# Patient Record
Sex: Male | Born: 1970 | Race: White | Hispanic: No | Smoking: Current every day smoker
Health system: Southern US, Community
[De-identification: ages and names within clinical notes are randomized; demographics above are authoritative.]

---

## 2015-08-09 ENCOUNTER — Encounter (HOSPITAL_COMMUNITY): Payer: Self-pay | Admitting: Family Medicine

## 2015-08-09 ENCOUNTER — Emergency Department (HOSPITAL_COMMUNITY): Payer: Self-pay

## 2015-08-09 ENCOUNTER — Emergency Department (HOSPITAL_COMMUNITY)
Admission: EM | Admit: 2015-08-09 | Discharge: 2015-08-09 | Disposition: A | Discharge status: Home / Self Care | Payer: Self-pay | Attending: Physician Assistant | Admitting: Physician Assistant

## 2015-08-09 DIAGNOSIS — Z720 Tobacco use: Secondary | ICD-10-CM | POA: Insufficient documentation

## 2015-08-09 DIAGNOSIS — K047 Periapical abscess without sinus: Secondary | ICD-10-CM | POA: Insufficient documentation

## 2015-08-09 DIAGNOSIS — K029 Dental caries, unspecified: Secondary | ICD-10-CM | POA: Insufficient documentation

## 2015-08-09 LAB — CBC WITH DIFFERENTIAL/PLATELET
BASOS ABS: 0.1 10*3/uL (ref 0.0–0.1)
Basophils Relative: 0 %
Eosinophils Absolute: 0.2 10*3/uL (ref 0.0–0.7)
Eosinophils Relative: 1 %
HEMATOCRIT: 43.4 % (ref 39.0–52.0)
Hemoglobin: 14.3 g/dL (ref 13.0–17.0)
LYMPHS PCT: 14 %
Lymphs Abs: 1.9 10*3/uL (ref 0.7–4.0)
MCH: 31.1 pg (ref 26.0–34.0)
MCHC: 32.9 g/dL (ref 30.0–36.0)
MCV: 94.3 fL (ref 78.0–100.0)
MONO ABS: 1.2 10*3/uL — AB (ref 0.1–1.0)
Monocytes Relative: 9 %
NEUTROS ABS: 10.3 10*3/uL — AB (ref 1.7–7.7)
Neutrophils Relative %: 76 %
Platelets: 322 10*3/uL (ref 150–400)
RBC: 4.6 MIL/uL (ref 4.22–5.81)
RDW: 14.2 % (ref 11.5–15.5)
WBC: 13.5 10*3/uL — AB (ref 4.0–10.5)

## 2015-08-09 LAB — BASIC METABOLIC PANEL
ANION GAP: 5 (ref 5–15)
BUN: 13 mg/dL (ref 6–20)
CHLORIDE: 108 mmol/L (ref 101–111)
CO2: 25 mmol/L (ref 22–32)
Calcium: 8.9 mg/dL (ref 8.9–10.3)
Creatinine, Ser: 0.79 mg/dL (ref 0.61–1.24)
GFR calc Af Amer: >60 mL/min (ref >60)
GFR calc non Af Amer: >60 mL/min (ref >60)
GLUCOSE: 92 mg/dL (ref 65–99)
POTASSIUM: 5.4 mmol/L — AB (ref 3.5–5.1)
Sodium: 138 mmol/L (ref 135–145)

## 2015-08-09 MED ORDER — ACETAMINOPHEN 325 MG PO TABS
650.0000 mg | ORAL_TABLET | Freq: Once | ORAL | Status: AC
  Administered 2015-08-09: 650 mg via ORAL
  Filled 2015-08-09: qty 2

## 2015-08-09 MED ORDER — HYDROCODONE-ACETAMINOPHEN 5-325 MG PO TABS: 1.0000 | ORAL_TABLET | ORAL | Status: AC | PRN

## 2015-08-09 MED ORDER — CLINDAMYCIN HCL 150 MG PO CAPS: 300.0000 mg | ORAL_CAPSULE | Freq: Three times a day (TID) | ORAL | Status: AC

## 2015-08-09 MED ORDER — IOHEXOL 300 MG/ML  SOLN
75.0000 mL | Freq: Once | INTRAMUSCULAR | Status: AC | PRN
  Administered 2015-08-09: 75 mL via INTRAVENOUS

## 2015-08-09 MED ORDER — CLINDAMYCIN PHOSPHATE 600 MG/50ML IV SOLN
600.0000 mg | Freq: Once | INTRAVENOUS | Status: AC
  Administered 2015-08-09: 600 mg via INTRAVENOUS
  Filled 2015-08-09: qty 50

## 2015-08-09 MED ORDER — MORPHINE SULFATE (PF) 4 MG/ML IV SOLN
4.0000 mg | Freq: Once | INTRAVENOUS | Status: AC
  Administered 2015-08-09: 4 mg via INTRAVENOUS
  Filled 2015-08-09: qty 1

## 2015-08-09 NOTE — ED Notes (Signed)
Pt received discharge instructions, asked if the hospital had a program to help with filling rx. Information given to [place], Charity fundraiserN, Sports coachCase manager. Pt will wait in waiting room for information.

## 2015-08-09 NOTE — Discharge Planning (Addendum)
ED CM consulted by ED RN for medication assistance  NCM reviewed chart review information and [facility] with the pt [facility] (no co pay for each [facility], does not include refills, 7 day expiration of MATCH letter and choice of pharmacies). [facility] (unable to find pt listed in PDMI per cardholder name inquiry) and has agreed to accept MATCH under terms discussed. PDMI information entered. MATCH letter completed and provided to pt.  NCM updated EDP and ED RN.

## 2015-08-09 NOTE — ED Notes (Signed)
Pt here for right dental abscess that extends into neck area with swelling.

## 2015-08-09 NOTE — Discharge Instructions (Signed)
Take the prescribed medication as directed. Follow-up with Dr. Lucky CowboyKnox--- call to make appt for follow-up. Return to the ED for new or worsening symptoms.

## 2015-08-09 NOTE — ED Provider Notes (Signed)
CSN: [id number]     Arrival date & time 08/09/15  0757 History   First MD Initiated Contact with Patient 08/09/15 [phone]     Chief Complaint  Patient presents with  . Abscess     (Consider location/radiation/quality/duration/timing/severity/associated sxs/prior Treatment) The history is provided by the patient and medical records.    44 year old male here with right neck swelling.  Patient states he began to have swelling in the right side of his neck 3 days ago which has been progressively worsening. He states now swelling has migrated to his right lower jaw. He has no dental pain whatsoever. He denies any fever or chills. Patient admits he has had difficulty swallowing over the past 24 hours. He states he is tolerating liquids better than solids, however still difficult to swallow. He denies any drooling or difficulty handling secretions.  Patient states he knows he has several dental abscesses in the past, however this feels different.  He is not currently established with a dentist.  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  History reviewed. No pertinent past medical history. History reviewed. No pertinent past surgical history. History reviewed. No pertinent family history. Social History  Substance Use Topics  . Smoking status: Current Every Day Smoker  . Smokeless tobacco: None  . Alcohol Use: None    Review of Systems  HENT: Positive for facial swelling.   All other systems reviewed and are negative.     Allergies  Abilify  Home Medications   Prior to Admission medications   Not on File   BP 126/90 mmHg  Pulse 77  Temp(Src) 97.9 F (36.6 C) (Oral)  Resp 18  Ht 5\' 6"  (1.676 m)  Wt 130 lb (58.968 kg)  BMI 20.99 kg/m2  SpO2 100%   Physical Exam  Constitutional: He is oriented to person, place, and time. He appears well-developed and well-nourished. No distress.  HENT:  Head: Normocephalic and atraumatic.  Mouth/Throat: Uvula is  midline, oropharynx is clear and moist and mucous membranes are normal. Abnormal dentition. Dental caries present. No oropharyngeal exudate, posterior oropharyngeal edema, posterior oropharyngeal erythema or tonsillar abscesses.  Teeth largely in poor dentition, right lower molars broken with obvious dental decay, surrounding gingiva swollen but without any tenderness whatsoever; swelling extending into right side of neck which is exquisitely tender; handling secretions well currently, no difficulty swallowing at present; normal phonation, no stridor  Eyes: Conjunctivae and EOM are normal. Pupils are equal, round, and reactive to light.  Neck: Normal range of motion. Neck supple.  Cardiovascular: Normal rate, regular rhythm and normal heart sounds.   Pulmonary/Chest: Effort normal and breath sounds normal. No respiratory distress. He has no wheezes.  Musculoskeletal: Normal range of motion.  Neurological: He is alert and oriented to person, place, and time.  Skin: Skin is warm and dry. He is not diaphoretic.  Track marks noted on arms  Psychiatric: He has a normal mood and affect.  Nursing note and vitals reviewed.   ED Course  Procedures (including critical care time) Labs Review Labs Reviewed  CBC WITH DIFFERENTIAL/PLATELET - Abnormal; Notable for the following:    WBC 13.5 (*)    Neutro Abs 10.3 (*)    Monocytes Absolute 1.2 (*)    All other components within normal limits  BASIC METABOLIC PANEL - Abnormal; Notable for the following:    Potassium 5.4 (*)    All other components within normal limits    Imaging  Review Ct Soft Tissue Neck W Contrast  08/09/2015  CLINICAL DATA:  RIGHT jaw pain. No recent dental procedure. Swelling. EXAM: CT NECK WITH CONTRAST TECHNIQUE: Multidetector CT imaging of the neck was performed using the standard protocol following the bolus administration of intravenous contrast. CONTRAST:  75mL OMNIPAQUE IOHEXOL 300 MG/ML  SOLN COMPARISON:  None. FINDINGS:  Pharynx and larynx: Symmetric pharyngeal mucosa. No mass or abnormal enhancement. Salivary glands: Normal Thyroid: Normal Lymph nodes: Several mildly enlarged set several prominent submental lymph nodes on the RIGHT. Vascular: Carotid sheaths are normal.  No vascular abnormality. Limited intracranial: Unremarkable. Visualized orbits: Unremarkable. Mastoids and visualized paranasal sinuses: Mastoid air cells are clear. Paranasal sinuses are clear. Skeleton: There is a periapical abscess involving the RIGHT mandible this abscess involves the third from last tooth on the RIGHT (1 st molar, tooth 30). Periapical abscess erupts through the mandible inferiorly to form a soft tissue abscess (image 57, series 8 and image 37, series 5). Ovoid fluid collection measuring 18 mm x 12 mm on image 57, series 3 superficial to the mandible in the soft tissues of the jaw. Potential periapical abscess involving the the second and third molar (31 and 32) tooth on the RIGHT. Upper chest: Centrilobular emphysema noted. IMPRESSION: 1. Periapical abscess of the RIGHT mandible involving the first molar and potentially the second and third molar 2. Soft tissue abscess adjacent to the periapical abscess of the RIGHT mandible. Electronically Signed   By: Genevive Bi M.D.   On: 08/09/2015 10:54   I have personally reviewed and evaluated these images and lab results as part of my medical decision-making.   EKG Interpretation None      MDM   Final diagnoses:  Dental abscess   44 year old male here with right facial and neck swelling over the past few days. Patient is afebrile, nontoxic. He does have swelling of his right lower molar concerning for dental abscess with extension into right side of neck. He denies any current dental pain which is somewhat abnormal. He reports he has difficulty swallowing.  Labwork as above, leukocytosis noted which is likely due to abscess. Given neck swelling, and CT soft tissue was obtained  revealing peri-apical abscesses of right mandible as well as some adjacent swelling.  No findings concerning for ludwig's angina.  Patient tolerating oral fluids and medications here in the ED, handling secretions well.  Continues to have normal phonation without stridor.  Feel patient is appropriately for outpatient management.  He was given dose of IV clindamycin here in the ED, will d/c home with same.  Short supply norco for pain.  Patient will follow-up with dentist on call, Dr. Lucky Cowboy.  Discussed plan with patient, he/she acknowledged understanding and agreed with plan of care.  Return precautions given for new or worsening symptoms.  Garlon Hatchet, PA-C 08/09/15 1447  Courteney Randall An, MD 08/09/15 1637

## 2015-08-10 ENCOUNTER — Encounter (HOSPITAL_COMMUNITY): Payer: Self-pay | Admitting: Emergency Medicine

## 2015-08-10 ENCOUNTER — Emergency Department (HOSPITAL_COMMUNITY)
Admission: EM | Admit: 2015-08-10 | Discharge: 2015-08-10 | Disposition: A | Discharge status: Other Acute Inpatient Hospital | Payer: Self-pay | Attending: Emergency Medicine | Admitting: Emergency Medicine

## 2015-08-10 ENCOUNTER — Emergency Department (HOSPITAL_COMMUNITY): Payer: Self-pay

## 2015-08-10 DIAGNOSIS — Z720 Tobacco use: Secondary | ICD-10-CM | POA: Insufficient documentation

## 2015-08-10 DIAGNOSIS — M272 Inflammatory conditions of jaws: Secondary | ICD-10-CM | POA: Insufficient documentation

## 2015-08-10 DIAGNOSIS — K047 Periapical abscess without sinus: Secondary | ICD-10-CM | POA: Insufficient documentation

## 2015-08-10 DIAGNOSIS — K122 Cellulitis and abscess of mouth: Secondary | ICD-10-CM | POA: Insufficient documentation

## 2015-08-10 LAB — CBC
HCT: 41.3 % (ref 39.0–52.0)
Hemoglobin: 13.9 g/dL (ref 13.0–17.0)
MCH: 31.5 pg (ref 26.0–34.0)
MCHC: 33.7 g/dL (ref 30.0–36.0)
MCV: 93.7 fL (ref 78.0–100.0)
PLATELETS: 303 10*3/uL (ref 150–400)
RBC: 4.41 MIL/uL (ref 4.22–5.81)
RDW: 14.1 % (ref 11.5–15.5)
WBC: 15.3 10*3/uL — AB (ref 4.0–10.5)

## 2015-08-10 LAB — BASIC METABOLIC PANEL
ANION GAP: 9 (ref 5–15)
BUN: 17 mg/dL (ref 6–20)
CALCIUM: 8.8 mg/dL — AB (ref 8.9–10.3)
CO2: 26 mmol/L (ref 22–32)
Chloride: 107 mmol/L (ref 101–111)
Creatinine, Ser: 0.82 mg/dL (ref 0.61–1.24)
Glucose, Bld: 131 mg/dL — ABNORMAL HIGH (ref 65–99)
POTASSIUM: 3.7 mmol/L (ref 3.5–5.1)
SODIUM: 142 mmol/L (ref 135–145)

## 2015-08-10 LAB — I-STAT CHEM 8, ED
BUN: 20 mg/dL (ref 6–20)
CHLORIDE: 105 mmol/L (ref 101–111)
Calcium, Ion: 1.14 mmol/L (ref 1.12–1.23)
Creatinine, Ser: 0.7 mg/dL (ref 0.61–1.24)
Glucose, Bld: 127 mg/dL — ABNORMAL HIGH (ref 65–99)
HCT: 43 % (ref 39.0–52.0)
HEMOGLOBIN: 14.6 g/dL (ref 13.0–17.0)
POTASSIUM: 3.6 mmol/L (ref 3.5–5.1)
SODIUM: 144 mmol/L (ref 135–145)
TCO2: 24 mmol/L (ref 0–100)

## 2015-08-10 MED ORDER — ONDANSETRON HCL 4 MG/2ML IJ SOLN
4.0000 mg | Freq: Once | INTRAMUSCULAR | Status: AC
  Administered 2015-08-10: 4 mg via INTRAVENOUS
  Filled 2015-08-10: qty 2

## 2015-08-10 MED ORDER — MORPHINE SULFATE (PF) 4 MG/ML IV SOLN
4.0000 mg | Freq: Once | INTRAVENOUS | Status: AC
  Administered 2015-08-10: 4 mg via INTRAVENOUS
  Filled 2015-08-10: qty 1

## 2015-08-10 MED ORDER — IOHEXOL 300 MG/ML  SOLN
75.0000 mL | Freq: Once | INTRAMUSCULAR | Status: AC | PRN
  Administered 2015-08-10: 75 mL via INTRAVENOUS

## 2015-08-10 MED ORDER — HYDROMORPHONE HCL 1 MG/ML IJ SOLN
1.0000 mg | INTRAMUSCULAR | Status: DC | PRN
  Administered 2015-08-10 (×2): 1 mg via INTRAVENOUS
  Filled 2015-08-10 (×2): qty 1

## 2015-08-10 MED ORDER — CLINDAMYCIN PHOSPHATE 600 MG/50ML IV SOLN
600.0000 mg | Freq: Once | INTRAVENOUS | Status: AC
  Administered 2015-08-10: 600 mg via INTRAVENOUS
  Filled 2015-08-10: qty 50

## 2015-08-10 NOTE — ED Provider Notes (Signed)
CSN: [id number]     Arrival date & time 08/10/15  1546 History   First MD Initiated Contact with Patient 08/10/15 1725     Chief Complaint  Patient presents with  . Abscess  . Dental Pain     (Consider location/radiation/quality/duration/timing/severity/associated sxs/prior Treatment) The history is provided by the patient. No language interpreter was used.     Charles Colon is a(n) 44 y.o. male who presents to the ED with CC of Right dental abscess. He was seen here for the same last night. Patient states that he has been unable to take norco bc he  Can no longer swallow the pills.  He has been taking motrin without relief. He states the swelling is much worse. He denies fevers. He acknowledges a change in his phonation  History reviewed. No pertinent past medical history. History reviewed. No pertinent past surgical history. No family history on file. Social History  Substance Use Topics  . Smoking status: Current Every Day Smoker -- 1.00 packs/day    Types: Cigarettes  . Smokeless tobacco: None  . Alcohol Use: No    Review of Systems   Ten systems reviewed and are negative for acute change, except as noted in the HPI.   Allergies  Abilify  Home Medications   Prior to Admission medications   Medication Sig Start Date End Date Taking? Authorizing Provider  clindamycin (CLEOCIN) 150 MG capsule Take 2 capsules (300 mg total) by mouth 3 (three) times daily. May dispense as 150mg  capsules 08/09/15   Garlon HatchetLisa M Sanders, PA-C  HYDROcodone-acetaminophen (NORCO/VICODIN) 5-325 MG tablet Take 1 tablet by mouth every 4 (four) hours as needed. 08/09/15   Garlon HatchetLisa M Sanders, PA-C   BP 145/101 mmHg  Pulse 98  Temp(Src) 98 F (36.7 C) (Oral)  Resp 16  Ht 5\' 7"  (1.702 m)  Wt 130 lb (58.968 kg)  BMI 20.36 kg/m2  SpO2 99% Physical Exam  Constitutional: He appears well-developed and well-nourished. No distress.  HENT:  Head: Normocephalic and atraumatic.  Patient with large palpable  abscess on the right lower masseter. He has poor dentition throughout. Right gingiva is thick and indurated. No areas of fluctuance. Uvula is midline, breathing easily, no stridor  Eyes: Conjunctivae are normal. No scleral icterus.  Neck: Normal range of motion. Neck supple.  Cardiovascular: Normal rate, regular rhythm and normal heart sounds.   Pulmonary/Chest: Effort normal and breath sounds normal. No respiratory distress.  Abdominal: Soft. There is no tenderness.  Musculoskeletal: He exhibits no edema.  Neurological: He is alert.  Skin: Skin is warm and dry. He is not diaphoretic.  Psychiatric: His behavior is normal.  Nursing note and vitals reviewed.   ED Course  Procedures (including critical care time) Labs Review Labs Reviewed  CBC - Abnormal; Notable for the following:    WBC 15.3 (*)    All other components within normal limits  BASIC METABOLIC PANEL - Abnormal; Notable for the following:    Glucose, Bld 131 (*)    Calcium 8.8 (*)    All other components within normal limits  I-STAT CHEM 8, ED - Abnormal; Notable for the following:    Glucose, Bld 127 (*)    All other components within normal limits    Imaging Review Ct Soft Tissue Neck W Contrast  08/09/2015  CLINICAL DATA:  RIGHT jaw pain. No recent dental procedure. Swelling. EXAM: CT NECK WITH CONTRAST TECHNIQUE: Multidetector CT imaging of the neck was performed using the standard protocol following the  bolus administration of intravenous contrast. CONTRAST:  75mL OMNIPAQUE IOHEXOL 300 MG/ML  SOLN COMPARISON:  None. FINDINGS: Pharynx and larynx: Symmetric pharyngeal mucosa. No mass or abnormal enhancement. Salivary glands: Normal Thyroid: Normal Lymph nodes: Several mildly enlarged set several prominent submental lymph nodes on the RIGHT. Vascular: Carotid sheaths are normal.  No vascular abnormality. Limited intracranial: Unremarkable. Visualized orbits: Unremarkable. Mastoids and visualized paranasal sinuses: Mastoid  air cells are clear. Paranasal sinuses are clear. Skeleton: There is a periapical abscess involving the RIGHT mandible this abscess involves the third from last tooth on the RIGHT (1 st molar, tooth 30). Periapical abscess erupts through the mandible inferiorly to form a soft tissue abscess (image 57, series 8 and image 37, series 5). Ovoid fluid collection measuring 18 mm x 12 mm on image 57, series 3 superficial to the mandible in the soft tissues of the jaw. Potential periapical abscess involving the the second and third molar (31 and 32) tooth on the RIGHT. Upper chest: Centrilobular emphysema noted. IMPRESSION: 1. Periapical abscess of the RIGHT mandible involving the first molar and potentially the second and third molar 2. Soft tissue abscess adjacent to the periapical abscess of the RIGHT mandible. Electronically Signed   By: Genevive Bi M.D.   On: 08/09/2015 10:54   I have personally reviewed and evaluated these images and lab results as part of my medical decision-making.   EKG Interpretation None      MDM   Final diagnoses:  Dental abscess  Cellulitis of mouth  Acute osteomyelitis of mandible    Patient with worsening odontogenic abscess, which is now involving the mandible and appears to have an osteomyelitic component. There is no oral surgery on call for the colon emergency department tonight. I have spoken with Dr. Redgie Grayer in the ER at [facility]. The patient will be transferred there for dental abscess drained. I started IV clindamycin, pain control. He is stable throughout his visit. Maintaining his airway. He appears safe for transfer. Patient seen in shared visit with attending physician.     Arthor Captain, PA-C 08/10/15 2038  Nelva Nay, MD 08/10/15 2053

## 2015-08-10 NOTE — ED Notes (Signed)
Pt states abscess to inside of mouth x6 days, states has been given antibiotics here and been on them for 2 days with no relief. Pt now reports unable to swallow, hoarse voice, right sided headache and right sided blurred vision due to pressure from abscess. Large swollen area to right side of mouth and swelling and tenderness noted to right mouth and right side of neck.
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
## 2022-02-22 ENCOUNTER — Inpatient Hospital Stay
Admit: 2022-02-22 | Discharge: 2022-02-22 | Disposition: A | Discharge status: Home / Self Care | Payer: MEDICAID | Attending: Emergency Medicine

## 2022-02-22 ENCOUNTER — Inpatient Hospital Stay: Admit: 2022-02-22 | Discharge: 2022-02-22 | Payer: MEDICAID | Attending: Emergency Medicine

## 2022-02-22 DIAGNOSIS — R1110 Vomiting, unspecified: Secondary | ICD-10-CM

## 2022-02-22 DIAGNOSIS — M4620 Osteomyelitis of vertebra, site unspecified: Principal | ICD-10-CM

## 2022-02-22 MED ORDER — HYDROMORPHONE HCL 2 MG PO TABS
2 MG | ORAL_TABLET | Freq: Four times a day (QID) | ORAL | 0 refills | Status: AC | PRN
Start: 2022-02-22 — End: 2022-02-24

## 2022-02-22 MED ORDER — DROPERIDOL 2.5 MG/ML IJ SOLN
2.5 MG/ML | Freq: Four times a day (QID) | INTRAMUSCULAR | Status: DC | PRN
Start: 2022-02-22 — End: 2022-02-22
  Administered 2022-02-22: 05:00:00 2.5 mg via INTRAMUSCULAR

## 2022-02-22 MED ORDER — ONDANSETRON 4 MG PO TBDP
4 MG | ORAL_TABLET | Freq: Three times a day (TID) | ORAL | 0 refills | Status: AC | PRN
Start: 2022-02-22 — End: ?

## 2022-02-22 MED ORDER — HYDROMORPHONE HCL PF 1 MG/ML IJ SOLN
1 MG/ML | INTRAMUSCULAR | Status: AC
Start: 2022-02-22 — End: 2022-02-22
  Administered 2022-02-22: 07:00:00 1 mg via INTRAMUSCULAR

## 2022-02-22 MED ORDER — ONDANSETRON 4 MG PO TBDP
4 MG | ORAL | Status: AC
Start: 2022-02-22 — End: 2022-02-22
  Administered 2022-02-22: 07:00:00 4 mg via ORAL

## 2022-02-22 MED ORDER — ONDANSETRON 4 MG PO TBDP
4 MG | ORAL_TABLET | Freq: Three times a day (TID) | ORAL | 0 refills | Status: DC | PRN
Start: 2022-02-22 — End: 2022-02-22

## 2022-02-22 MED ORDER — HYDROMORPHONE HCL 2 MG PO TABS
2 MG | ORAL_TABLET | Freq: Four times a day (QID) | ORAL | 0 refills | Status: DC | PRN
Start: 2022-02-22 — End: 2022-02-22

## 2022-02-22 MED FILL — DROPERIDOL 2.5 MG/ML IJ SOLN: 2.5 MG/ML | INTRAMUSCULAR | Qty: 2

## 2022-02-22 MED FILL — ONDANSETRON 4 MG PO TBDP: 4 MG | ORAL | Qty: 1

## 2022-02-22 MED FILL — HYDROMORPHONE HCL 1 MG/ML IJ SOLN: 1 MG/ML | INTRAMUSCULAR | Qty: 1

## 2022-02-22 NOTE — ED Notes (Signed)
Attempted to give pt discharge instructions. Pt visualized with family member in parking lot. Pt was previously aware that his scripts were e-scribed to [facility] on [address].      Milbert Coulter, RN  02/22/22 [phone]

## 2022-02-22 NOTE — ED Triage Notes (Addendum)
Pt presents to ED with CC of nausea, vomiting, and diarrhea that started at 5pm this afternoon. Pt said he wasn't weaned off pain medications and after his last dose of pain med, he felt sick. Pt reports abd pain, chest pain when coughing, and back pain from hx of osteomyelitis. Pt is on dilaudid PO, last dose at 10am.

## 2022-02-22 NOTE — ED Provider Notes (Signed)
[facility] EMERGENCY DEP  EMERGENCY DEPARTMENT ENCOUNTER      Pt Name: Adam Day  MRN: [id number]  Birthdate [birth year]  Date of evaluation: 02/22/2022  Provider: Rhoderick Moody, DO      HISTORY OF PRESENT ILLNESS      51 year old male presents to the emergency department stating that he was just discharged from [facility] this afternoon at about 5 PM where he was admitted and treated for spinal osteomyelitis for the past 6 weeks.  While there he was reportedly given frequent doses of IV pain medication and he was discharged home with only 2 days worth of oral Dilaudid after reportedly not being tapered off of the pain medications.  He states that he is from [place] and he is going to follow-up with a pain management provider but he expresses concern for not having enough medication to get him through the weekend until he can be seen by them.  He states that he has started having nausea, vomiting, diarrhea and increased anxiety consistent with withdrawal symptoms.  He states that in preparation for his discharge he was told that he was going to be prescribed more of a longer taper of pain medications but when he was actually discharged it was done by a different physician and he was only prescribed 2 days worth of medications.  He states that he refuses to go back to the hospital that he was just discharged from because he reportedly had a bad experience there.  Records were reviewed and he was seen at [facility] about an hour prior to arrival where he was given a dose of droperidol and then he eloped.  He has a large packet of discharge information describing recent hospitalization, including prescriptions for medications.            Nursing Notes were reviewed.    REVIEW OF SYSTEMS         Review of Systems        PAST MEDICAL HISTORY   No past medical history on file.      SURGICAL HISTORY     No past surgical history on file.      CURRENT MEDICATIONS       Previous Medications     No medications on file       ALLERGIES     Abilify [aripiprazole]    FAMILY HISTORY     No family history on file.       SOCIAL HISTORY            PHYSICAL EXAM       ED Triage Vitals [02/22/22 0200]   BP Temp Temp Source Pulse Respirations SpO2 Height Weight   (!) 132/92 98.3 F (36.8 C) Temporal (!) 107 16 100 % -- --       There is no height or weight on file to calculate BMI.    Physical Exam  Vitals and nursing note reviewed.   Constitutional:       General: He is not in acute distress.     Appearance: Normal appearance. He is not ill-appearing.      Comments: Ambulatory into the emergency department with nasal cannula in place on portable O2.   HENT:      Head: Normocephalic and atraumatic.      Nose: Nose normal.      Mouth/Throat:      Mouth: Mucous membranes are moist.   Eyes:      Extraocular Movements: Extraocular movements intact.  Conjunctiva/sclera: Conjunctivae normal.      Pupils: Pupils are equal, round, and reactive to light.   Cardiovascular:      Rate and Rhythm: Normal rate and regular rhythm.      Heart sounds: Normal heart sounds.   Pulmonary:      Effort: Pulmonary effort is normal.      Breath sounds: Normal breath sounds.   Abdominal:      General: There is no distension.      Palpations: Abdomen is soft.      Tenderness: There is no abdominal tenderness.   Musculoskeletal:      Cervical back: Neck supple.   Skin:     General: Skin is warm and dry.   Neurological:      General: No focal deficit present.      Mental Status: He is alert and oriented to person, place, and time.   Psychiatric:         Mood and Affect: Mood is anxious.         Speech: Speech is rapid and pressured.         Behavior: Behavior is agitated and hyperactive.       DIAGNOSTIC RESULTS       RADIOLOGY:     Interpretation per the Radiologist below, if available at the time of this note:    No orders to display        LABS:  Labs Reviewed - No data to display      EMERGENCY DEPARTMENT COURSE and DIFFERENTIAL  DIAGNOSIS/MDM:   Vitals:    Vitals:    02/22/22 0200   BP: (!) 132/92   Pulse: (!) 107   Resp: 16   Temp: 98.3 F (36.8 C)   TempSrc: Temporal   SpO2: 100%         Medical Decision Making  51 year old male presents with opiate withdrawal symptoms after just being discharged from the hospital for osteomyelitis.  He was offered buprenorphine medication and prescription but declined stating that he is going to be on chronic pain medications and following with pain management.  We will provide him with 2 more days worth of medication to get him through the weekend until he can follow-up with his pain management and outpatient providers early next week.  Also Rx Zofran for further symptomatic relief.        REASSESSMENT          CONSULTS:  None    PROCEDURES:     Procedures            (Please note that portions of this note were completed with a voice recognition program.  Efforts were made to edit the dictations but occasionally words are mis-transcribed.)    Rhoderick Moody, DO (electronically signed)  Emergency Attending Physician              Francoise Ceo, DO  02/22/22 [phone]

## 2022-02-22 NOTE — ED Triage Notes (Signed)
Triage: Pt arrives ambulatory from home with CC of vomiting. He reports he is in withdrawal from pain medication. He was on morphine and dilaudid at [facility]. He was admitted there for 6 weeks for osteomyelitis.

## 2022-02-22 NOTE — ED Notes (Signed)
Pt and daughter decided to drive to another hospital because "they won't be getting the right care in a hospital this small." Pt also wanting dilaudid but told him it wasn't ordered. Pt left AMA, refused to sign form.     Pitman Asis, RN  02/22/22 [phone]

## 2022-02-22 NOTE — ED Notes (Signed)
Per triage tech, pt throwing things and yelling obscenities in waiting room. Pt redirected. Dilaudid administered.      Milbert Coulter, RN  02/22/22 [phone]

## 2022-02-22 NOTE — ED Provider Notes (Incomplete)
[facility] EMERGENCY DEPT  EMERGENCY DEPARTMENT ENCOUNTER       Pt Name: Adam Day  MRN: [id number]  Birthdate Oct 04, 1970  Date of evaluation: 02/22/2022  Provider: Mertha Finders, MD   PCP: No primary care provider on file.  Note Started: 1:09 AM 02/22/22     CHIEF COMPLAINT       No chief complaint on file.       HISTORY OF PRESENT ILLNESS: 1 or more elements      History From: ***, History limited by: ***none     Adam Day is a 51 y.o. male who presents ambulatory to the ED complaining of nausea vomiting diarrhea back pain.  Has charge papers with him from [facility] and states he was just discharged there at 5 PM and he is still feeling badly.  States he was admitted for discitis and osteomyelitis.  Complaining of persistent back pain despite extensive treatment.  Asking for pain meds, angry at other facility for discharging him still in pain.  However, apparently he was discharged with Dilaudid.  He still has these pills but states they will only last 2 days.  Looking at his EDIE I do not see any patient encounters at [facility] in [place].  I see multiple patient encounters and staffed     Nursing Notes were all reviewed and agreed with or any disagreements were addressed in the HPI.     REVIEW OF SYSTEMS        Positives and Pertinent negatives as per HPI.    PAST HISTORY     Past Medical History:  No past medical history on file.    Past Surgical History:  No past surgical history on file.    Family History:  No family history on file.    Social History:       Allergies:  Allergies   Allergen Reactions    Abilify [Aripiprazole] Other (See Comments)     Facial rigidity       CURRENT MEDICATIONS      Previous Medications    No medications on file       SCREENINGS               No data recorded         PHYSICAL EXAM      ED Triage Vitals [02/22/22 0055]   Enc Vitals Group      BP 139/87      Pulse 86      Respirations 20      Temp 98.4 F (36.9 C)      Temp Source Oral      SpO2 97 %      Weight - Scale 135  lb (61.2 kg)      Height 5\' 6"  (1.676 m)      Head Circumference       Peak Flow       Pain Score       Pain Loc       Pain Edu?       Excl. in GC?        Physical Exam     DIAGNOSTIC RESULTS   LABS:    No results found for this or any previous visit (from the past 24 hour(s)).    EKG: If performed, independent interpretation documented below in the ED course or MDM section     RADIOLOGY:  Non-plain film images such as CT, Ultrasound and MRI are read by the radiologist. Plain radiographic images  are visualized and preliminarily interpreted by the ED Provider with the findings documented in the MDM section.     Interpretation per the Radiologist below, if available at the time of this note:     No orders to display          PROCEDURES   Unless otherwise noted below, none  Procedures       EMERGENCY DEPARTMENT COURSE and DIFFERENTIAL DIAGNOSIS/MDM   Vitals:    Vitals:    02/22/22 0055   BP: 139/87   Pulse: 86   Resp: 20   Temp: 98.4 F (36.9 C)   TempSrc: Oral   SpO2: 97%   Weight: 61.2 kg (135 lb)   Height: 1.676 m (5\' 6" )        Patient was given the following medications:  Medications - No data to display    Medical Decision Making  Patient story does not add up.  He has stable vital signs.  He was just discharged from another hospital 8 hours ago.  I am going to attempt to reach a physician at [facility] facility to see if they can explain the medical issues at the time of discharge from their facility 8 hours ago.    Spoke with Dr. Dawna Part at [facility] who was kind enough to assist me by pulling up patient's discharge summary from approx 8 hours ago.        **PLEASE SEE ED COURSE BELOW FOR FURTHER MDM DETAILS:    ED Course as of 02/22/22 0637   Fri Feb 22, 2022   0136 I was able to speak to the ED doc at [facility].  Pulled up his discharge summary and was able to tell me more about his admission.  Note summarizing our conversation to follow.  In the meantime, patient walked out anywhere he was not going to get  Dilaudid. [SS]      ED Course User Index  [SS] Mertha Finders, MD         FINAL IMPRESSION   No diagnosis found.      DISPOSITION/PLAN   [name]  results have been reviewed with him.  He has been counseled regarding his diagnosis, treatment, and plan.  He verbally conveys understanding and agreement of the signs, symptoms, diagnosis, treatment and prognosis and additionally agrees to follow up as discussed.  He also agrees with the care-plan and conveys that all of his questions have been answered.  I have also provided discharge instructions for him that include: educational information regarding their diagnosis and treatment, and list of reasons why they would want to return to the ED prior to their follow-up appointment, should his condition change.        PATIENT REFERRED TO:  No follow-up provider specified.     DISCHARGE MEDICATIONS:     Medication List      You have not been prescribed any medications.           DISCONTINUED MEDICATIONS:  There are no discharge medications for this patient.      I am the Primary Clinician of Record.   Mertha Finders, MD (electronically signed)    (Please note that parts of this dictation were completed with voice recognition software. Quite often unanticipated grammatical, syntax, homophones, and other interpretive errors are inadvertently transcribed by the computer software. Please disregards these errors. Please excuse any errors that have escaped final proofreading.)

## 2022-04-28 NOTE — ED Notes (Signed)
Formatting of this note might be different from the original.  Pt provided with O2 tank while waiting for ride in waiting room. Pt stated he needed to go outside and smoke first. Pt reminded tank is to stay in waiting room.     Tia Masker, RN  04/28/22 1132    Electronically signed by Tia Masker, RN at 04/28/2022 11:32 AM EDT

## 2022-04-28 NOTE — Unmapped (Signed)
Formatting of this note is different from the original.  [facility]?NOTIFICATION?04/28/2022 09:03?TRUMAN, ACEITUNO J?MRN: [id number]    Criteria Met      3+ ED Locations in 90 Days    5+ ED Visits in 12 Months    Security and Safety  No Security Events were found.  ED Care Guidelines  There are currently no ED Care Guidelines for this patient. Please check your facility's medical records system.    Prescription Drug Data  No Prescription Drug Data was found.    E.D. Visit Count (12 mo.)  Facility Visits   [facility] 1   [facility] 1   [facility] 2   [place] 4   [facility] 1   Total 12   Note: Visits indicate total known visits.     Recent Emergency Department Visit Summary  Showing 10 most recent visits out of 12 in the past 12 months   Date [place] Type Diagnoses or Chief Complaint    Apr 28, 20[place]      Chronic obstructive pulmonary disease with (acute) exacerbation      Chest Pain      Shortness of Breath     Apr 14, 2022  [place]      Long term (current) use of inhaled steroids      Nicotine dependence, unspecified, uncomplicated      Encounter for issue of repeat prescription      Emphysema, unspecified     Feb 22, 2022  [facility]  [place]  [facility]gency     Feb 22, 2022  [place]  Emergency      Nicotine dependence, unspecified, uncomplicated      Long term (current) use of inhaled steroids      Opioid dependence with withdrawal      Dorsalgia, unspecified      Emphysema, unspecified      Nausea with vomiting, unspecified     Feb 22, 2022  [place]  [facility]      Diarrhea, unspecified      Opioid use, unspecified with withdrawal      Nausea  with vomiting, unspecified      Osteomyelitis of vertebra, site unspecified      Opioid dependence, uncomplicated     Feb 23, 271  [place]  [place]  [facility]      Vomiting, unspecified     Jan 17, 2022  [place]  Emergency     Dec 25, 2021  [place].  [place]  Emergency     Dec 21, 2021  [place]  Emergency      Discitis, unspecified, site unspecified      Osteomyelitis, unspecified      Back Pain      Signing back in     Dec 20, 2021  [place]  Emergency  Recent Inpatient Visit Summary  Date Facility [facility] Type Diagnoses or Chief Complaint    Feb 22, 2022  [place]  [facility]      Allergy status to narcotic agent      Sheltered homelessness      Other chronic pain      Procedure and treatment not carried out because of patient's decision for other reasons      Chronic obstructive pulmonary disease, unspecified      Atherosclerotic heart disease of native coronary artery without angina pectoris      Long term (current) use of inhaled steroids      Other psychoactive substance use, unspecified, uncomplicated      Opioid dependence with withdrawal      Insomnia, unspecified     Jan 17, 2022  [facility]  [place]  [facility]      Insufficient social insurance and welfare support      Food insecurity      Homelessness unspecified      Body mass index [BMI] 19.9 or less, adult      Anxiety disorder, unspecified      Unspecified mood [affective] disorder      Chronic viral hepatitis C      Unspecified protein-calorie malnutrition      Opioid dependence with withdrawal      Osteomyelitis of vertebra, lumbar region     Dec 24, 2021  [place]  Medical Surgical      Osteomyelitis, unspecified      Osteomyelitis [facility])     Dec 24, 2021  [place]  [facility]  Inpatient     Dec 22, 2021  [place].  [place]  Neurology      Osteomyelitis, unspecified      Osteomyelitis of lumbar spine     Dec 21, 2021  [place]  Medical Surgical      Osteomyelitis, unspecified      Discitis, unspecified, site unspecified     Dec 20, 2021  [place]  Inpatient     Dec 19, 2021  [facility]      Low back pain, unspecified      Discitis, unspecified, multiple sites in spine      Other chronic pain       Care Team  Provider Specialty Phone Fax Service Dates   Pricilla Holm , MD Internal Medicine   Current      [facility]  This patient has registered at the [facility]   For more information visit: [email or web address]     PLEASE NOTE:     1.   Any care recommendations and other clinical information are provided as guidelines or for historical purposes only, and providers should exercise their own clinical judgment when providing care.    2.   You may only use this information for purposes of treatment, payment or health care operations activities, and subject to the limitations of applicable [facility] Policies.    3.   You should consult directly with the organization that provided a care guideline or other clinical history with any questions about additional information or accuracy or completeness of information provided.    ? 0454 [facility] -  www.[email or web address]     Electronically signed by Interface, Incoming Transcriptions at 04/28/2022 11:14 AM EDT

## 2022-04-28 NOTE — ED Provider Notes (Signed)
Formatting of this note is different from the original.          Final diagnoses:   None     HPI     Chief Complaint   Patient presents with   ? Chest Pain   ? Shortness of Breath     Ms. Adam Day is a 51 year old male with a medical history notable for COPD and tobacco use disorder who presents with a chief complaint of chest pain and shortness of breath.  Patient reports onset the symptoms beginning yesterday.  Reports left-sided chest discomfort that is severe in nature with radiation down his left upper extremity.  Reports that he is on home 3 L nasal cannula.  Endorses daily cigarette smoking, however reports that as of late his tobacco use has been limited.  Denies any lower extremity swelling.  Patient does report a recent admission for discitis to an outside hospital.    Per EMS, patient found to be in the low 80s on arrival on room air.  6 L nasal cannula and breathing treatment initiated with sats coming up to the upper 90s.  Twelve-lead in route reportedly demonstrating normal sinus rhythm.        ED Course & MDM     ED Course as of 04/28/22 1109   Sun Apr 28, 2022   1038 On reexamination patient is now on his baseline 3 L nasal cannula.  Satting appropriately on exam.  Lungs clear to auscultation on reexamination.  Patient asking for pain medications given his recent diagnosis of discitis.  Additionally, request for albuterol inhaler for home. [JW]   1109 Acute chest pain    Discussed all results and my impression with patient. The patient will be discharged home with appropriate instructions and prescriptions as needed. The patient agrees with their disposition and assures me that they will return to the ED with any new or worsening symptoms. The patient understands and agrees with the follow up plan. All questions answered. [JW]     ED Course User Index  [JW] Ernesto Rutherford, MD     Medical Decision Making  Patient presents with acute dyspnea and associated chest pain.  Presents via EMS hemodynamically  stable and well-appearing on exam without acute distress.  Exam reveals diminished breath sounds throughout.  Current clinic concern for COPD exacerbation.  EKG obtained on arrival demonstrating nonischemic pattern.  Will further stratify ACS with troponin.  Chest x-ray to rule in/out acute pneumonia.  Will obtain basic labs to evaluate for electrolyte derangement versus dehydration.  Pending reassessment.    Amount and/or Complexity of Data Reviewed  Labs: ordered.  Radiology: ordered.    Risk  Prescription drug management.                    SAFE-T Assessment         Patient History     History reviewed. No pertinent past medical history.    History reviewed. No pertinent surgical history.    No family history on file.         Review of Systems     Review of Systems   All other systems reviewed and are negative.     Physical Exam     ED Triage Vitals   Temp Heart Rate Resp BP   04/28/22 0907 04/28/22 0903 04/28/22 0904 --   36.6 C (97.8 F) 70 18      SpO2 Temp Source Heart Rate Source Patient Position   04/28/22 [phone]  04/28/22 0907 -- --   99 % Oral       BP Location FiO2 (%)     -- --         Physical Exam  Vitals reviewed.   Constitutional:       General: He is not in acute distress.     Appearance: Normal appearance.   HENT:      Head: Normocephalic and atraumatic.   Eyes:      Extraocular Movements: Extraocular movements intact.      Conjunctiva/sclera: Conjunctivae normal.   Cardiovascular:      Rate and Rhythm: Normal rate and regular rhythm.      Pulses: Normal pulses.   Pulmonary:      Effort: Pulmonary effort is normal. No respiratory distress.      Breath sounds: Decreased breath sounds present.   Abdominal:      General: Abdomen is flat. Bowel sounds are normal.      Palpations: Abdomen is soft.   Skin:     General: Skin is warm.      Findings: No erythema.   Neurological:      General: No focal deficit present.      Mental Status: He is alert and oriented to person, place, and time. Mental status is  at baseline.   Psychiatric:         Mood and Affect: Mood normal.         Behavior: Behavior normal.         Please see nursing notes for additional information regarding past medical, family, and social history as well as medication and vital signs. Please see nursing documentation for medications given.      Ernesto Rutherford, MD  04/28/22 [phone]    Electronically signed by Ernesto Rutherford, MD at 04/28/2022  9:14 AM EDT

## 2022-04-28 NOTE — Unmapped (Signed)
Associated Order(s): ECG 12 lead  Formatting of this note might be different from the original.  Procedure  ECG 12 lead  Performed by: Ernesto Rutherford, MD  Authorized by: Ernesto Rutherford, MD     ECG reviewed by ED Physician in the absence of a cardiologist: yes    Interpretation:     Interpretation: normal    Rate:     ECG rate assessment: normal    Rhythm:     Rhythm: sinus rhythm    Ectopy:     Ectopy: none    QRS:     QRS axis:  Normal    QRS intervals:  Normal    QRS conduction: normal    ST segments:     ST segments:  Normal  T waves:     T waves: normal        Ernesto Rutherford, MD  04/28/22 [phone]    Electronically signed by Ernesto Rutherford, MD at 04/28/2022  9:07 AM EDT

## 2022-08-01 ENCOUNTER — Emergency Department: Admit: 2022-08-02 | Payer: PRIVATE HEALTH INSURANCE

## 2022-08-01 DIAGNOSIS — J441 Chronic obstructive pulmonary disease with (acute) exacerbation: Principal | ICD-10-CM

## 2022-08-01 NOTE — ED Provider Notes (Signed)
[facility]  EMERGENCY DEPARTMENT ENCOUNTER         Pt Name: Quashon Jesus  MRN: [id number]  [place] 08/01/1971  Date of evaluation: 08/01/2022  Provider: Raelyn Mora, MD   PCP: No primary care provider on file.  Note Started: 11:08 AM 08/12/22     CHIEF COMPLAINT       Chief Complaint   Patient presents with    Shortness of Breath     Pt arrived to ED from home with SOB  and CP x 2 days on 3L NC Baseline for COPD.  Pt is out all medications.         HISTORY OF PRESENT ILLNESS: 1 or more elements      History From: Patient  HPI Limitations: None     Morell Mears is a 51 y.o. male with past medical history of copd (o2 dependent and on 3L of o2 at baseline) and tobacco use disorder who presents to the ED via private vehicle for a 2 day history of sob, wheezing and chest tightness, symptoms that are typical of prior copd exacerbations. Denies recent hospitalizations. Denies new or productive cough, fever, chills, abd pain or any other symptoms. CP is a tightness like sensation, no exertional or pleuritic cp. No leg swelling or pain.  He reports that he has not been able to see his pcp due to change in pcp office and has run out of all of his medications including bronchodilators which he normally uses regularly. Last use of albuterol was yesterday.  No recent abx or steroids.      Please see more comprehensive history below under MDM  Nursing Notes were all reviewed in real time as they are made available. Any disagreements addressed in the HPI/MDM.     REVIEW OF SYSTEMS      Review of Systems   Constitutional:  Negative for chills.   HENT:  Negative for congestion.    Eyes:  Negative for visual disturbance.   Respiratory:  Positive for chest tightness, shortness of breath and wheezing. Negative for stridor.    Cardiovascular:  Positive for chest pain. Negative for palpitations and leg swelling.   Gastrointestinal:  Negative for abdominal pain, diarrhea, nausea and vomiting.   Musculoskeletal:  Negative for  arthralgias and myalgias.   Skin:  Negative for color change and rash.   Neurological:  Negative for syncope, light-headedness and headaches.   Hematological:  Negative for adenopathy.   Psychiatric/Behavioral:  Negative for confusion.         Positives and Pertinent negatives as per HPI and MDM.    PAST HISTORY     Past Medical History:  No past medical history on file.    Past Surgical History:  No past surgical history on file.    Family History:  No family history on file.    Social History:       Allergies:  Allergies   Allergen Reactions    Abilify [Aripiprazole] Other (See Comments)     Facial rigidity       CURRENT MEDICATIONS      Discharge Medication List as of 08/02/2022  2:56 AM        CONTINUE these medications which have NOT CHANGED    Details   ondansetron (ZOFRAN-ODT) 4 MG disintegrating tablet Take 1 tablet by mouth 3 times daily as needed for Nausea or Vomiting, Disp-21 tablet, R-0Normal               PHYSICAL  EXAM      ED Triage Vitals [08/01/22 2018]   Enc Vitals Group      BP (!) 124/95      Pulse 78      Respirations 20      Temp 97.7 F (36.5 C)      Temp Source Oral      SpO2 95 %      Weight - Scale 72.3 kg (159 lb 6.3 oz)      Height 1.702 m (_0 )      Head Circumference       Peak Flow       Pain Score       Pain Loc       Pain Edu?       Excl. in [place]?        Physical Exam  Vitals and nursing note reviewed.   Constitutional:       Appearance: He is not diaphoretic.   HENT:      Mouth/Throat:      Mouth: Mucous membranes are moist.      Pharynx: Oropharynx is clear.   Neck:      Vascular: No JVD.   Cardiovascular:      Rate and Rhythm: Normal rate and regular rhythm.      Pulses: Normal pulses.      Heart sounds: Normal heart sounds.   Pulmonary:      Effort: Pulmonary effort is normal.      Breath sounds: No stridor. Wheezing present. No decreased breath sounds, rhonchi or rales.   Chest:      Chest wall: No tenderness or crepitus.   Abdominal:      General: Bowel sounds are normal.       Palpations: Abdomen is soft.      Tenderness: There is no abdominal tenderness.   Musculoskeletal:         General: Normal range of motion.      Cervical back: Neck supple.      Right lower leg: No tenderness. No edema.      Left lower leg: No tenderness. No edema.   Skin:     General: Skin is warm and dry.      Capillary Refill: Capillary refill takes less than 2 seconds.      Coloration: Skin is not cyanotic or pale.   Neurological:      General: No focal deficit present.      Mental Status: He is alert and oriented to person, place, and time.            DIAGNOSTIC RESULTS   LABS:      Recent Results (from the past 24 hour(s))   EKG 12 Lead    Collection Time: 08/01/22  8:29 PM   Result Value Ref Range    Ventricular Rate 95 BPM    Atrial Rate 95 BPM    P-R Interval 120 ms    QRS Duration 90 ms    Q-T Interval 330 ms    QTc Calculation (Bazett) 414 ms    P Axis 51 degrees    R Axis 61 degrees    T Axis 64 degrees    Diagnosis       Normal sinus rhythm    Confirmed by Gildardo Griffes M.D. [id number]) on 08/02/2022 11:24:05 AM     CBC with Auto Differential    Collection Time: 08/01/22  8:33 PM   Result Value Ref Range    WBC  10.2 4.1 - 11.1 K/uL    RBC 4.38 4.10 - 5.70 M/uL    Hemoglobin 14.0 12.1 - 17.0 g/dL    Hematocrit 41.8 36.6 - 50.3 %    MCV 95.4 80.0 - 99.0 FL    MCH 32.0 26.0 - 34.0 PG    MCHC 33.5 30.0 - 36.5 g/dL    RDW 15.9 (H) 11.5 - 14.5 %    Platelets 225 150 - 400 K/uL    MPV 9.9 8.9 - 12.9 FL    Nucleated RBCs 0.0 0 PER 100 WBC    nRBC 0.00 0.00 - 0.01 K/uL    Neutrophils % 83 (H) 32 - 75 %    Lymphocytes % 13 12 - 49 %    Monocytes % 3 (L) 5 - 13 %    Eosinophils % 0 0 - 7 %    Basophils % 0 0 - 1 %    Immature Granulocytes 1 (H) 0.0 - 0.5 %    Neutrophils Absolute 8.5 (H) 1.8 - 8.0 K/UL    Lymphocytes Absolute 1.3 0.8 - 3.5 K/UL    Monocytes Absolute 0.3 0.0 - 1.0 K/UL    Eosinophils Absolute 0.0 0.0 - 0.4 K/UL    Basophils Absolute 0.0 0.0 - 0.1 K/UL    Absolute Immature Granulocyte 0.1 (H) 0.00 -  0.04 K/UL    Differential Type AUTOMATED     Comprehensive Metabolic Panel    Collection Time: 08/01/22  8:33 PM   Result Value Ref Range    Sodium 142 136 - 145 mmol/L    Potassium 3.8 3.5 - 5.1 mmol/L    Chloride 106 97 - 108 mmol/L    CO2 28 21 - 32 mmol/L    Anion Gap 8 5 - 15 mmol/L    Glucose 158 (H) 65 - 100 mg/dL    BUN 11 6 - 20 MG/DL    Creatinine 0.84 0.70 - 1.30 MG/DL    Bun/Cre Ratio 13 12 - 20      Est, Glom Filt Rate >60 >60 ml/min/1.62m    Calcium 8.4 (L) 8.5 - 10.1 MG/DL    Total Bilirubin 0.4 0.2 - 1.0 MG/DL    ALT 36 12 - 78 U/L    AST 14 (L) 15 - 37 U/L    Alk Phosphatase 57 45 - 117 U/L    Total Protein 7.0 6.4 - 8.2 g/dL    Albumin 3.6 3.5 - 5.0 g/dL    Globulin 3.4 2.0 - 4.0 g/dL    Albumin/Globulin Ratio 1.1 1.1 - 2.2     Troponin    Collection Time: 08/01/22  8:33 PM   Result Value Ref Range    Troponin, High Sensitivity 4 0 - 76 ng/L   Brain Natriuretic Peptide    Collection Time: 08/01/22  8:33 PM   Result Value Ref Range    NT Pro-BNP 79 <125 PG/ML   Troponin    Collection Time: 08/01/22 11:37 PM   Result Value Ref Range    Troponin, High Sensitivity 5 0 - 76 ng/L          RADIOLOGY:  Non-plain film images such as CT, Ultrasound and MRI are read by the radiologist. Plain radiographic images are visualized and preliminarily interpreted by the ED Provider with the below findings:     Cxr reviewed as soon as images were available. No acute process identified. Final radiology read to follow and will be copied below.  Interpretation per the Radiologist below, if available at the time of this note:     XR CHEST (2 VW)   Final Result   No acute finding.               PROCEDURES   Unless otherwise documented all procedures performed by me. Raelyn Mora, MD      SCREENINGS   Unless otherwise documented, all screenings conducted, scored and interpreted by me. Raelyn Mora, MD  Heart Score: 2  Risk of MACE: low        Scores 0-3 Low risk: 0.9-1.7% risk of adverse cardiac event. In the HEART  Score study, these patients were discharged.  Scores 4-6 Moderate risk: 12-16.6% risk of adverse cardiac event. In the HEART Score study, these patients were admitted to the hospital.   Scores ?7 High risk: 50-65% risk of adverse cardiac event. In the HEART Score study, these patients were candidates for early invasive measures.    A MACE (Major Adverse Cardiac Event) was defined as all-cause mortality, myocardial infarction, or coronary revascularization.      CRITICAL CARE TIME       MEDICAL DECISION MAKING     Vitals:    Vitals:    08/01/22 2018 08/01/22 2106 08/02/22 0220   BP: (!) 124/95 (!) 122/92 115/87   Pulse: 78 96 71   Resp: _0 Temp: 97.7 F (36.5 C) 97.9 F (36.6 C)    TempSrc: Oral     SpO2: 95% 97% 94%   Weight: 72.3 kg (159 lb 6.3 oz)     Height: 1.702 m (_1 )          Pertinent Chronic Medical Conditions or Prior Surgeries: Listed under PMH above and includes copd, tobacco use disorder    Social Determinants affecting Dx or Tx: None    Records Reviewed: Nursing Notes and Old Medical Records  I reviewed and interpreted the nursing notes and and vital signs from today's visit, as well as the electronic medical record system for any external medical records that were available that may contribute to the patients current condition. Nursing notes will be reviewed and interpreted by me as they become available in realtime while the pt has been in the ED.    EKG interpretation by ED physician in the absence of a cardiologist: Rhythm: sinus; and regular . Rate (approx.): 95; ST/T wave: nml; Other intervals normal. This and prior available ECGs have been viewed and interpreted by me personally. Charolotte Capuchin, MD, Msc    Cardiac monitor interpretation by ED physician:    The cardiac monitor revealed normal sinus rhythm. Rate in 80s-90s. Normal.     Pulse ox interpretation by ED physician:   O2 sat is 96% on 3L o2 via nc, normal on baseline o2 requirement.    Continuous cardiac and pulse ox monitoring  necessary to monitor patient for sings of cardiac dysrhythmia, acute hypoxemia, RR abnormalities, which patient is at risk for based on their history, risk for cardiovascular disease, pulmonary disease and/or metabolic abnormalities.       CC/HPI Summary, DDx, MDM, ED Course, and Reassessment:     Patient presents with sob, chest tightness, wheezing x 2 days, symptoms consistent with and typical of prior copd exacerbations. He is not sure as to trigger. No fever, chills, new or productive cough. No exertional or pleuritic cp. ROS otherwise negative.    In the ED he is overall well appearing, speaking in full  sentences. Vital signs normal including normal o2 sat on his baseline 3L of oxygen.  On exam, bilateral expiratory wheezes noted throughout both lung fields and slightly prolonged expiratory phase. No diminished breath sounds, no stridor. CV exam is normal.    Clinical impression is that of acute copd exacerbation. , Etiology likely multifactorial including change in weather, running out of his medications and continued daily tobacco use. Lower concern for pna but cxr obtained to confirm no abnormalities. Considered ACS as well given history of cp and some present risk factors although based on history and clinical assessment, my suspicion for cardiac etiology such as acs or CHF is low. ECG reviewed and demonstrates nsr, no evidence of acute ischemia. Also very low suspicion for PE given no pleuritic cp, no tachycardia, no evidence of dvt. Based on my clinical gestalt and low suspicion, no further workup for this indicated.    Full metabolic panel, cbc, serial high sens troponins, bnp, cxr obtained. In meantime patient treated with 3 duonebs and iv steroids.      Reassessment:    Patient feeling significantly better after treatment. Lungs now clear. Has no longer any complaints and feels well, would like to go home.    I personally reviewed and interpreted the patient's laboratory and imaging studies. No acute  abnormalities are demonstrated. Chemistry grossly normal. Bnp normal at 79 making chf unlikely. Serial trops are 4 and 5, negative, acs therefore unlikely. Cbc also normal, wbc is 10.2, hgb 14. Cxr shows no acute process and I have a low suspicion for pna.    Will dc home on course of steroids and will also provide patient refills on his medications which he has run out of. He has a pcp appt scheduled and this is coming up next week. Will follow up there as well. Return precautions reviewed in detail.             TOBACCO COUNSELING:   Upon evaluation, pt expressed that they are an active tobacco user. For approximately 6 minutes, I counseled pt face-to-face on the dangers of smoking and encouraged quitting as soon as possible in order to decrease further risks to their health. I assessed their readiness to quit smoking and patient states they are actively thinking about it but the habit of smoking as well as euphoric effect of nicotine are barriers. I encouraged them to continue thinking about it and to set a quitting date. I provided specific suggestions on how to quit smoking, including CBT, support groups, acupuncture, medication assistance. Pt has conveyed their understanding of the risks involved should they continue to use tobacco products.        Medical Decision Making  Amount and/or Complexity of Data Reviewed  Labs: ordered. Decision-making details documented in ED Course.  Radiology: ordered and independent interpretation performed. Decision-making details documented in ED Course.  ECG/medicine tests: ordered and independent interpretation performed. Decision-making details documented in ED Course.    Risk  OTC drugs.  Prescription drug management.  Parenteral controlled substances.  Decision regarding hospitalization.          Disposition Considerations and Planning:  I have discussed with the patient and/or caregiver my initial clinical impression which is based on an evidence-based clinical evaluation  of the patient and interpretation of available results. Involved patient and/or caregiver in management, treatment options and final disposition. Patient and/or caregiver verbalizes understanding and agreement.    ED Medications Administered  Medications   ipratropium 0.5 mg-albuterol 2.5 mg (DUONEB) nebulizer solution  3 Dose (3 Doses Inhalation Given 08/02/22 0230)   methylPREDNISolone sodium (PF) (SOLU-MEDROL PF) injection 125 mg (125 mg IntraVENous Given 08/02/22 0220)   albuterol sulfate HFA (PROVENTIL;VENTOLIN;PROAIR) 108 (90 Base) MCG/ACT inhaler 4 puff (4 puffs Inhalation Given 08/02/22 0334)       ED Orders Placed:  Orders Placed This Encounter   Procedures    XR CHEST (2 VW)    CBC with Auto Differential    Comprehensive Metabolic Panel    Troponin    Brain Natriuretic Peptide    EKG 12 Lead    Insert peripheral IV           FINAL IMPRESSION     1. Acute exacerbation of chronic obstructive pulmonary disease (COPD) ([place])    2. Chronic hypoxemic respiratory failure ([facility])    3. Tobacco abuse    4. Chronic bilateral low back pain without sciatica          DISPOSITION/PLAN     Progress note:  Patient has been reassessed and reports feeling considerably better, has normal vital signs and feels comfortable going home. I think this is reasonable as no findings today suggest a life-threatening condition.     DISPOSITION: DISCHARGE  The patient's results have been reviewed with patient and available family and/or caregiver. They verbally convey their understanding and agreement of the patient's signs, symptoms, diagnosis, treatment and prognosis and additionally agree to follow up as recommended in the discharge instructions or to return to the Emergency Department should the patient's condition change prior to their follow-up appointment.   The patient and available family and/or caregiver verbally agree with the care plan and all of their questions have been answered. The discharge instructions have also been provided  to the them with educational information regarding the patient's diagnosis as well a list of reasons why the patient would want to return to the ER prior to their follow-up appointment should any concerns arise, the patient's condition change or symptoms worsen.    Charolotte Capuchin, MD, Msc    PLAN:     Medication List        START taking these medications      * albuterol sulfate HFA 108 (90 Base) MCG/ACT inhaler  Commonly known as: Ventolin HFA  Inhale 2 puffs into the lungs 4 times daily as needed for Wheezing     * albuterol (2.5 MG/3ML) 0.083% nebulizer solution  Commonly known as: PROVENTIL  Take 3 mLs by nebulization every 6 hours as needed for Wheezing     predniSONE 10 MG (48) Tbpk  Use as directed           * This list has 2 medication(s) that are the same as other medications prescribed for you. Read the directions carefully, and ask your doctor or other care provider to review them with you.                ASK your doctor about these medications      HYDROmorphone 2 MG tablet  Commonly known as: Dilaudid  Take 1 tablet by mouth 3 times daily as needed for Pain for up to 5 days. Max Daily Amount: 6 mg  Ask about: Should I take this medication?     ondansetron 4 MG disintegrating tablet  Commonly known as: ZOFRAN-ODT  Take 1 tablet by mouth 3 times daily as needed for Nausea or Vomiting               Where to Get Your  Medications        These medications were sent to [id number] [place] [place] [address] [id number]     Phone: [phone]  albuterol (2.5 MG/3ML) 0.083% nebulizer solution  albuterol sulfate HFA 108 (90 Base) MCG/ACT inhaler  HYDROmorphone 2 MG tablet  predniSONE 10 MG (48) Tbpk       2.   primary care and pain management    Schedule an appointment as soon as possible for a visit       3.   Return to ED if worse       I am the Primary Clinician of Record.   TRaelyn Mora MD  (electronically signed)    (Please note that parts of this dictation were completed with voice recognition software. Quite often unanticipated grammatical, syntax, homophones, and other interpretive errors are inadvertently transcribed by the computer software. Please disregards these errors. Please excuse any errors that have escaped final proofreading.)           ZRaelyn Mora MD  08/12/22 1134

## 2022-08-02 ENCOUNTER — Inpatient Hospital Stay
Admit: 2022-08-02 | Discharge: 2022-08-02 | Disposition: A | Discharge status: Home / Self Care | Payer: PRIVATE HEALTH INSURANCE | Attending: Emergency Medicine

## 2022-08-02 LAB — COMPREHENSIVE METABOLIC PANEL
ALT: 36 U/L (ref 12–78)
AST: 14 U/L — ABNORMAL LOW (ref 15–37)
Albumin/Globulin Ratio: 1.1 (ref 1.1–2.2)
Albumin: 3.6 g/dL (ref 3.5–5.0)
Alk Phosphatase: 57 U/L (ref 45–117)
Anion Gap: 8 mmol/L (ref 5–15)
BUN: 11 MG/DL (ref 6–20)
Bun/Cre Ratio: 13 (ref 12–20)
CO2: 28 mmol/L (ref 21–32)
Calcium: 8.4 MG/DL — ABNORMAL LOW (ref 8.5–10.1)
Chloride: 106 mmol/L (ref 97–108)
Creatinine: 0.84 MG/DL (ref 0.70–1.30)
Est, Glom Filt Rate: >60 mL/min/{1.73_m2} (ref >60)
Globulin: 3.4 g/dL (ref 2.0–4.0)
Glucose: 158 mg/dL — ABNORMAL HIGH (ref 65–100)
Potassium: 3.8 mmol/L (ref 3.5–5.1)
Sodium: 142 mmol/L (ref 136–145)
Total Bilirubin: 0.4 MG/DL (ref 0.2–1.0)
Total Protein: 7 g/dL (ref 6.4–8.2)

## 2022-08-02 LAB — CBC WITH AUTO DIFFERENTIAL
Absolute Immature Granulocyte: 0.1 10*3/uL — ABNORMAL HIGH (ref 0.00–0.04)
Basophils %: 0 % (ref 0–1)
Basophils Absolute: 0 10*3/uL (ref 0.0–0.1)
Eosinophils %: 0 % (ref 0–7)
Eosinophils Absolute: 0 10*3/uL (ref 0.0–0.4)
Hematocrit: 41.8 % (ref 36.6–50.3)
Hemoglobin: 14 g/dL (ref 12.1–17.0)
Immature Granulocytes: 1 % — ABNORMAL HIGH (ref 0.0–0.5)
Lymphocytes %: 13 % (ref 12–49)
Lymphocytes Absolute: 1.3 10*3/uL (ref 0.8–3.5)
MCH: 32 PG (ref 26.0–34.0)
MCHC: 33.5 g/dL (ref 30.0–36.5)
MCV: 95.4 FL (ref 80.0–99.0)
MPV: 9.9 FL (ref 8.9–12.9)
Monocytes %: 3 % — ABNORMAL LOW (ref 5–13)
Monocytes Absolute: 0.3 10*3/uL (ref 0.0–1.0)
Neutrophils %: 83 % — ABNORMAL HIGH (ref 32–75)
Neutrophils Absolute: 8.5 10*3/uL — ABNORMAL HIGH (ref 1.8–8.0)
Nucleated RBCs: 0 PER 100 WBC
Platelets: 225 10*3/uL (ref 150–400)
RBC: 4.38 M/uL (ref 4.10–5.70)
RDW: 15.9 % — ABNORMAL HIGH (ref 11.5–14.5)
WBC: 10.2 10*3/uL (ref 4.1–11.1)
nRBC: 0 10*3/uL (ref 0.00–0.01)

## 2022-08-02 LAB — EKG 12-LEAD
Atrial Rate: 95 {beats}/min
Diagnosis: NORMAL
P Axis: 51 degrees
P-R Interval: 120 ms
Q-T Interval: 330 ms
QRS Duration: 90 ms
QTc Calculation (Bazett): 414 ms
R Axis: 61 degrees
T Axis: 64 degrees
Ventricular Rate: 95 {beats}/min

## 2022-08-02 LAB — TROPONIN
Troponin, High Sensitivity: 4 ng/L (ref 0–76)
Troponin, High Sensitivity: 5 ng/L (ref 0–76)

## 2022-08-02 LAB — BRAIN NATRIURETIC PEPTIDE: NT Pro-BNP: 79 PG/ML (ref <125)

## 2022-08-02 MED ORDER — ALBUTEROL SULFATE (2.5 MG/3ML) 0.083% IN NEBU
Freq: Four times a day (QID) | RESPIRATORY_TRACT | 1 refills | Status: DC | PRN
Start: 2022-08-02 — End: 2024-07-02

## 2022-08-02 MED ORDER — METHYLPREDNISOLONE NA SUC (PF) 125 MG IJ SOLR
125 MG | Freq: Once | INTRAMUSCULAR | Status: AC
Start: 2022-08-02 — End: 2022-08-02
  Administered 2022-08-02: 06:00:00 125 mg via INTRAVENOUS

## 2022-08-02 MED ORDER — ALBUTEROL SULFATE HFA 108 (90 BASE) MCG/ACT IN AERS
108 | Freq: Four times a day (QID) | RESPIRATORY_TRACT | 1 refills | Status: DC | PRN
Start: 2022-08-02 — End: 2024-07-02

## 2022-08-02 MED ORDER — PREDNISONE 10 MG (48) PO TBPK
10 | ORAL | 0 refills | 6.00000 days | Status: DC
Start: 2022-08-02 — End: 2024-07-02

## 2022-08-02 MED ORDER — HYDROMORPHONE HCL 2 MG PO TABS
2 MG | ORAL_TABLET | Freq: Three times a day (TID) | ORAL | 0 refills | Status: AC | PRN
Start: 2022-08-02 — End: 2022-08-07

## 2022-08-02 MED ORDER — IPRATROPIUM-ALBUTEROL 0.5-2.5 (3) MG/3ML IN SOLN
RESPIRATORY_TRACT | Status: AC
Start: 2022-08-02 — End: 2022-08-02
  Administered 2022-08-02: 07:00:00 3 via RESPIRATORY_TRACT

## 2022-08-02 MED ORDER — ALBUTEROL SULFATE HFA 108 (90 BASE) MCG/ACT IN AERS
108 (90 Base) MCG/ACT | RESPIRATORY_TRACT | Status: AC
Start: 2022-08-02 — End: 2022-08-02
  Administered 2022-08-02: 08:00:00 4 via RESPIRATORY_TRACT

## 2022-08-02 NOTE — Discharge Instructions (Signed)
List of Pain Management Specialists:    Barsanti, John M., M.D. [phone] Pain Management  Gibellato, Axzel M., M.D. [phone] Physical Medicine and Rehabilitation  Jones, Aaron M., M.D. [phone] Physical Medicine and Rehabilitation  Long, Stephen P., M.D. [phone] Pain Management  Nazmi, Peyman, M.D. [phone] Pain Management    Dr. Stephen Long                                        Dr. Timothy Silver  [facility]                   [facility]  [address]                                         [address]  [place] [address]                                     [place] [address]  [phone]                                              [phone]    [facility]                            Dr. Benjamin Seeman, G MD DO  [address]                               [address], [address]  [place] [address]                            , [place] [address]  [phone]                                                  [phone]    Dr. Gaynor Vokac                                         Dr. Michael Decker  [address]                                   [address] [place] [address]                                     [place] [address]  [phone]                                                    [phone]                                                                            Dr. Andrew Hou                                               Dr. Michael DePalma  [facility] [address]          [facility]  [address]                                           [address]  [place] [address]                                        [place] [address]  [email or web address]                                            [phone]  [phone]                                                                                                                           Dr. James Stone      Dr. Jarnell Bonner  [facility]    [address]  [address] [place] [address]  [place] [address]       [phone]  [phone]              Dr. Peter Coleman                                     Dr. Jawad Bhatti  PCP/Pain Specialist                                   [address]  [address]                                  [place] [address]  [phone]                                             [phone]      Dr. Salmaan Khawaja     Dr. Salmaan Khawaja  [address]  [place] [address]     [place] [address]  [phone]  [phone]     Please also consider natural alternatives to pain relief such as physical therapy, massage therapy, acupuncture, chiropractors, reflexology, and trigger point injections.

## 2022-08-02 NOTE — ED Notes (Addendum)
Pt refusing to wait for medical transport (so that he may be transported home on O2). His personal O2 tank is empty, and he is refusing to request family to go home to retrieve his personal compressor. This would mean he would go home (daughter driving) without oxygen. Pt pulled out his peripheral IV and reports he is unwilling to wait any longer. Pt reports wearing 3L at baseline. MD Zanin made aware and spoke with Pt and his daughter about plan of care. Verbal order for inhaler, which I retrieved and gave to Pt quickly before he left without waiting any longer.     Pt denies acute distress upon discharge. He is ambulatory and speaking in full sentences.        Lavena Bullion, Lota Leamer, RN  08/02/22 [phone]

## 2022-12-10 NOTE — Telephone Encounter (Signed)
We received a referral from [facility] to schedule an appt. Left pt voicemail to call our office and schedule an appt.        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

## 2022-12-12 NOTE — Telephone Encounter (Signed)
We received a referral from [facility]cp[facility] to schedule an appt. Left pt voicemail to call our office and schedule an appt.     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

## 2022-12-26 NOTE — Telephone Encounter (Signed)
We received a referral from [facility]cp[facility] to schedule an appt. Left pt voicemail to call our office and schedule an appt.

## 2023-02-20 ENCOUNTER — Encounter: Payer: PRIVATE HEALTH INSURANCE | Attending: Specialist

## 2023-02-27 ENCOUNTER — Encounter: Payer: PRIVATE HEALTH INSURANCE | Attending: Specialist

## 2023-02-27 NOTE — Progress Notes (Signed)
Patient Missed appt- please do not reschedule appt- per dr Zenaida Niece

## 2023-09-29 DIAGNOSIS — J449 Chronic obstructive pulmonary disease, unspecified: Secondary | ICD-10-CM

## 2023-09-29 NOTE — ED Triage Notes (Signed)
 Pt states that for the last four days he has been having left sided chest pain and shortness of breath with a throbbing headache. Pt states that he has a hx of COPD and is weaning himself off using oxygen but the SOB has been so bad lately that he had to u

## 2023-09-29 NOTE — ED Notes (Addendum)
 [facility] EM[facility]Y DEPT  EMERGENCY DEPARTMENT ENCOUNTER      Pt Name: Adam Day  MRN: [id number]  Birthdate [birth year]  Date of evaluation: 09/29/2023  Provider: Belia Heman. Abbigaile Rockman, MD  9:36 PM    CHIEF COMPLAINT       Chief Complaint   Patient presents with    Sh

## 2023-09-30 ENCOUNTER — Inpatient Hospital Stay
Admit: 2023-09-30 | Discharge: 2023-09-30 | Discharge status: Against Medical Advice | Payer: PRIVATE HEALTH INSURANCE | Admitting: Emergency Medicine

## 2023-09-30 LAB — COMPREHENSIVE METABOLIC PANEL
ALT: 19 U/L (ref 12–78)
AST: 15 U/L (ref 15–37)
Albumin/Globulin Ratio: 1.2 (ref 1.1–2.2)
Albumin: 4.1 g/dL (ref 3.5–5.0)
Alk Phosphatase: 83 U/L (ref 45–117)
Anion Gap: 11 mmol/L (ref 2–12)
BUN/Creatinine Ratio: 9 — ABNORMAL LOW (ref 12–20)
BUN: 9 mg/dL (ref 6–20)
CO2: 26 mmol/L (ref 21–32)
Calcium: 9.1 mg/dL (ref 8.5–10.1)
Chloride: 105 mmol/L (ref 97–108)
Creatinine: 1.01 mg/dL (ref 0.70–1.30)
Est, Glom Filt Rate: 88 mL/min/{1.73_m2} (ref >60)
Globulin: 3.4 g/dL (ref 2.0–4.0)
Glucose: 145 mg/dL — ABNORMAL HIGH (ref 65–100)
Potassium: 4 mmol/L (ref 3.5–5.1)
Sodium: 142 mmol/L (ref 136–145)
Total Bilirubin: 0.5 mg/dL (ref 0.2–1.0)
Total Protein: 7.5 g/dL (ref 6.4–8.2)

## 2023-09-30 LAB — EKG 12-LEAD
Atrial Rate: 101 {beats}/min
P Axis: 78 degrees
P-R Interval: 141 ms
Q-T Interval: 313 ms
QRS Duration: 95 ms
QTc Calculation (Bazett): 406 ms
R Axis: 67 degrees
T Axis: 63 degrees
Ventricular Rate: 101 {beats}/min

## 2023-09-30 LAB — CBC WITH AUTO DIFFERENTIAL
Basophils %: 1 % (ref 0–1)
Basophils Absolute: 0 10*3/uL (ref 0.0–0.1)
Eosinophils %: 0 % (ref 0–7)
Eosinophils Absolute: 0 10*3/uL (ref 0.0–0.4)
Hematocrit: 47.3 % (ref 36.6–50.3)
Hemoglobin: 16.8 g/dL (ref 12.1–17.0)
Immature Granulocytes %: 0 % (ref 0–0.5)
Immature Granulocytes Absolute: 0 10*3/uL (ref 0.00–0.04)
Lymphocytes %: 20 % (ref 12–49)
Lymphocytes Absolute: 1.7 10*3/uL (ref 0.8–3.5)
MCH: 32 pg (ref 26.0–34.0)
MCHC: 35.5 g/dL (ref 30.0–36.5)
MCV: 90.1 fL (ref 80.0–99.0)
MPV: 10.2 fL (ref 8.9–12.9)
Monocytes %: 6 % (ref 5–13)
Monocytes Absolute: 0.5 10*3/uL (ref 0.0–1.0)
Neutrophils %: 73 % (ref 32–75)
Neutrophils Absolute: 6.3 10*3/uL (ref 1.8–8.0)
Nucleated RBCs: 0 /100{WBCs}
Platelets: 290 10*3/uL (ref 150–400)
RBC: 5.25 M/uL (ref 4.10–5.70)
RDW: 13.2 % (ref 11.5–14.5)
WBC: 8.6 10*3/uL (ref 4.1–11.1)
nRBC: 0 10*3/uL (ref 0.00–0.01)

## 2023-09-30 LAB — TROPONIN: Troponin, High Sensitivity: <4 ng/L (ref 0–76)

## 2023-09-30 MED ORDER — METHYLPREDNISOLONE SODIUM SUCC 125 MG IJ SOLR
125 | INTRAMUSCULAR | Status: AC
Start: 2023-09-30 — End: 2023-09-29
  Administered 2023-09-30: 03:00:00 80 mg via INTRAVENOUS

## 2023-09-30 MED ORDER — IPRATROPIUM-ALBUTEROL 0.5-2.5 (3) MG/3ML IN SOLN
0.5-2.5 | RESPIRATORY_TRACT | Status: AC
Start: 2023-09-30 — End: 2023-09-29
  Administered 2023-09-30: 03:00:00 1 via RESPIRATORY_TRACT

## 2023-09-30 MED FILL — METHYLPREDNISOLONE SODIUM SUCC 125 MG IJ SOLR: 125 MG | INTRAMUSCULAR | Qty: 125

## 2023-09-30 MED FILL — IPRATROPIUM-ALBUTEROL 0.5-2.5 (3) MG/3ML IN SOLN: 0.5-2.5 (3) MG/3ML | RESPIRATORY_TRACT | Qty: 3

## 2023-09-30 NOTE — ED Notes (Signed)
 Patient at nurses station yelling "Fuck you. Call the fucking cops. I'll fuck you up!" After a private discussion with Dr Harrie Limb.  Security on scene immediately and patient escorted off of the property.

## 2024-07-02 ENCOUNTER — Inpatient Hospital Stay
Admit: 2024-07-02 | Discharge: 2024-07-02 | Disposition: A | Discharge status: Home / Self Care | Payer: Medicaid (Managed Care) | Arrived: WI | Attending: Emergency Medicine

## 2024-07-02 DIAGNOSIS — J441 Chronic obstructive pulmonary disease with (acute) exacerbation: Secondary | ICD-10-CM

## 2024-07-02 MED ORDER — AZITHROMYCIN 250 MG PO TABS
250 | ORAL_TABLET | ORAL | 0 refills | 5.00000 days | Status: AC
Start: 2024-07-02 — End: 2024-07-12

## 2024-07-02 MED ORDER — IPRATROPIUM-ALBUTEROL 0.5-2.5 (3) MG/3ML IN SOLN
0.5-2.5 | RESPIRATORY_TRACT | Status: AC
Start: 2024-07-02 — End: 2024-07-02
  Administered 2024-07-02: 22:00:00 1 via RESPIRATORY_TRACT

## 2024-07-02 MED ORDER — AZITHROMYCIN 250 MG PO TABS
250 | ORAL | Status: AC
Start: 2024-07-02 — End: 2024-07-02
  Administered 2024-07-02: 22:00:00 500 mg via ORAL

## 2024-07-02 MED ORDER — ALBUTEROL SULFATE (2.5 MG/3ML) 0.083% IN NEBU
Freq: Four times a day (QID) | RESPIRATORY_TRACT | 1 refills | 25.00000 days | Status: AC | PRN
Start: 2024-07-02 — End: ?

## 2024-07-02 MED ORDER — TRELEGY ELLIPTA 100-62.5-25 MCG/ACT IN AEPB
100-62.5-25 | Freq: Every day | RESPIRATORY_TRACT | 3 refills | 30.00000 days | Status: AC
Start: 2024-07-02 — End: ?

## 2024-07-02 MED ORDER — PREDNISONE 20 MG PO TABS
20 | ORAL_TABLET | Freq: Every day | ORAL | 0 refills | 6.00000 days | Status: AC
Start: 2024-07-02 — End: 2024-07-07

## 2024-07-02 MED ORDER — METHYLPREDNISOLONE SODIUM SUCC 125 MG IJ SOLR
125 | Freq: Every day | INTRAMUSCULAR | Status: DC
Start: 2024-07-02 — End: 2024-07-02
  Administered 2024-07-02: 22:00:00 125 mg via INTRAMUSCULAR

## 2024-07-02 MED ORDER — IPRATROPIUM-ALBUTEROL 20-100 MCG/ACT IN AERS
20-100 | Freq: Four times a day (QID) | RESPIRATORY_TRACT | 2 refills | 20.00000 days | Status: AC
Start: 2024-07-02 — End: ?

## 2024-07-02 MED ORDER — ALBUTEROL SULFATE HFA 108 (90 BASE) MCG/ACT IN AERS
108 | Freq: Four times a day (QID) | RESPIRATORY_TRACT | 1 refills | 25.00000 days | Status: AC | PRN
Start: 2024-07-02 — End: ?

## 2024-07-02 MED FILL — AZITHROMYCIN 250 MG PO TABS: 250 mg | ORAL | Qty: 2 | Fill #0

## 2024-07-02 MED FILL — IPRATROPIUM-ALBUTEROL 0.5-2.5 (3) MG/3ML IN SOLN: 0.5-2.5 (3) MG/3ML | RESPIRATORY_TRACT | Qty: 3 | Fill #0

## 2024-07-02 MED FILL — METHYLPREDNISOLONE SODIUM SUCC 125 MG IJ SOLR: 125 mg | INTRAMUSCULAR | Qty: 125 | Fill #0

## 2024-07-02 NOTE — ED Notes (Signed)
"  Discharged home to self.  Ambulatory out of ED.  VS WDL.  0 s/s acute distress.  Respirations even and unlabored.  Discharge instructions and follow up care reviewed.  Patient receptive and demonstrated knowledge of instruction via teach-back method.    "
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
## 2024-07-02 NOTE — Discharge Instructions (Signed)
"  Return to the ER for any new or worsening shortness of breath, or any other new or concerning symptoms.        Thank you for choosing our Emergency Department for your care.  It is our privilege to care for you in your time of need.  In the next several days, you may receive a survey via email or mailed to your home about your experience with our team.  We would greatly appreciate you taking a few minutes to complete the survey, as we use this information to learn what we have done well and what we could be doing better. Thank you for trusting us  with your care!    Below you will find a list of your tests from today's visit.   Labs and Radiology Studies  No results found for this or any previous visit (from the past 12 hours).  No results found.  ------------------------------------------------------------------------------------------------------------  The evaluation and treatment you received in the Emergency Department were for an urgent problem. It is important that you follow-up with a doctor, nurse practitioner, or physician assistant to:  (1) confirm your diagnosis,  (2) re-evaluation of changes in your illness and treatment, and (3) for ongoing care. Please take your discharge instructions with you when you go to your follow-up appointment.     If you have any problem arranging a follow-up appointment, contact us !  If your symptoms become worse or you do not improve as expected, please return to us . We are available 24 hours a day.     If a prescription has been provided, please fill it as soon as possible to prevent a delay in treatment. If you have any questions or reservations about taking the medication due to side effects or interactions with other medications, please call your primary care provider or contact us  directly.  Again, THANK YOU for choosing us  to care for YOU!    "

## 2024-07-02 NOTE — ED Triage Notes (Signed)
"  Pt on 2L via [place] chronically for emphysema. PT reports he is out of refills due to not having transportation for appointments. PT reports he only needs refills for his inhalers and medications. Reports he is having issues due to not having a ID. PT reports he is also running out of portable 02 tanks but has a concentrator at home. MD in to see pt during triage.   "
